# Patient Record
Sex: Male | Born: 1970 | Race: Black or African American | Hispanic: No | Marital: Single | Smoking: Never smoker
Health system: Southern US, Community
[De-identification: ages and names within clinical notes are randomized; demographics above are authoritative.]

## PROBLEM LIST (undated history)

## (undated) HISTORY — PX: ABLATION: SHX5711

---

## 2013-09-28 ENCOUNTER — Encounter (HOSPITAL_COMMUNITY): Payer: Self-pay | Admitting: Emergency Medicine

## 2013-09-28 ENCOUNTER — Emergency Department (HOSPITAL_COMMUNITY): Payer: BC Managed Care – PPO

## 2013-09-28 ENCOUNTER — Inpatient Hospital Stay (HOSPITAL_COMMUNITY)
Admission: EM | Admit: 2013-09-28 | Discharge: 2013-10-01 | DRG: 287 | Disposition: A | Discharge status: Home / Self Care | Payer: BC Managed Care – PPO | Attending: Internal Medicine | Admitting: Internal Medicine

## 2013-09-28 DIAGNOSIS — I471 Supraventricular tachycardia: Secondary | ICD-10-CM

## 2013-09-28 DIAGNOSIS — R931 Abnormal findings on diagnostic imaging of heart and coronary circulation: Secondary | ICD-10-CM

## 2013-09-28 DIAGNOSIS — R0602 Shortness of breath: Secondary | ICD-10-CM | POA: Diagnosis present

## 2013-09-28 DIAGNOSIS — F1490 Cocaine use, unspecified, uncomplicated: Secondary | ICD-10-CM | POA: Diagnosis present

## 2013-09-28 DIAGNOSIS — R072 Precordial pain: Principal | ICD-10-CM | POA: Diagnosis present

## 2013-09-28 DIAGNOSIS — R079 Chest pain, unspecified: Secondary | ICD-10-CM

## 2013-09-28 DIAGNOSIS — F1410 Cocaine abuse, uncomplicated: Secondary | ICD-10-CM | POA: Diagnosis present

## 2013-09-28 DIAGNOSIS — I10 Essential (primary) hypertension: Secondary | ICD-10-CM | POA: Diagnosis present

## 2013-09-28 LAB — CBC
HCT: 41.2 % (ref 39.0–52.0)
Hemoglobin: 14.3 g/dL (ref 13.0–17.0)
MCH: 30 pg (ref 26.0–34.0)
MCHC: 34.7 g/dL (ref 30.0–36.0)
MCV: 86.4 fL (ref 78.0–100.0)
Platelets: 273 10*3/uL (ref 150–400)
RBC: 4.77 MIL/uL (ref 4.22–5.81)
RDW: 12.9 % (ref 11.5–15.5)
WBC: 7.1 10*3/uL (ref 4.0–10.5)

## 2013-09-28 LAB — BASIC METABOLIC PANEL
BUN: 20 mg/dL (ref 6–23)
CO2: 24 meq/L (ref 19–32)
Calcium: 9.6 mg/dL (ref 8.4–10.5)
Chloride: 102 mEq/L (ref 96–112)
Creatinine, Ser: 1.43 mg/dL — ABNORMAL HIGH (ref 0.50–1.35)
GFR calc non Af Amer: 58 mL/min — ABNORMAL LOW (ref >90)
GFR, EST AFRICAN AMERICAN: 68 mL/min — AB (ref >90)
Glucose, Bld: 105 mg/dL — ABNORMAL HIGH (ref 70–99)
Potassium: 4 mEq/L (ref 3.7–5.3)
Sodium: 138 mEq/L (ref 137–147)

## 2013-09-28 LAB — D-DIMER, QUANTITATIVE (NOT AT ARMC)

## 2013-09-28 LAB — I-STAT TROPONIN, ED: TROPONIN I, POC: 0 ng/mL (ref 0.00–0.08)

## 2013-09-28 LAB — APTT: aPTT: 28 seconds (ref 24–37)

## 2013-09-28 LAB — TROPONIN I: Troponin I: <0.3 ng/mL (ref <0.30)

## 2013-09-28 LAB — PROTIME-INR
INR: 0.94 (ref 0.00–1.49)
PROTHROMBIN TIME: 12.4 s (ref 11.6–15.2)

## 2013-09-28 LAB — PRO B NATRIURETIC PEPTIDE: Pro B Natriuretic peptide (BNP): 22.8 pg/mL (ref 0–125)

## 2013-09-28 MED ORDER — ACETAMINOPHEN 650 MG RE SUPP: 650.0000 mg | Freq: Four times a day (QID) | RECTAL | Status: DC | PRN

## 2013-09-28 MED ORDER — SODIUM CHLORIDE 0.9 % IV SOLN
INTRAVENOUS | Status: DC
  Administered 2013-09-28: via INTRAVENOUS

## 2013-09-28 MED ORDER — ONDANSETRON HCL 4 MG PO TABS: 4.0000 mg | ORAL_TABLET | Freq: Four times a day (QID) | ORAL | Status: DC | PRN

## 2013-09-28 MED ORDER — ACETAMINOPHEN 325 MG PO TABS
650.0000 mg | ORAL_TABLET | Freq: Four times a day (QID) | ORAL | Status: DC | PRN
  Administered 2013-09-29 – 2013-09-30 (×2): 650 mg via ORAL
  Filled 2013-09-28 (×2): qty 2

## 2013-09-28 MED ORDER — HYDROMORPHONE HCL PF 1 MG/ML IJ SOLN: 0.5000 mg | INTRAMUSCULAR | Status: DC | PRN

## 2013-09-28 MED ORDER — NITROGLYCERIN 2 % TD OINT
0.5000 [in_us] | TOPICAL_OINTMENT | Freq: Four times a day (QID) | TRANSDERMAL | Status: DC
  Administered 2013-09-28 – 2013-09-30 (×6): 0.5 [in_us] via TOPICAL
  Filled 2013-09-28: qty 30

## 2013-09-28 MED ORDER — NITROGLYCERIN 0.4 MG SL SUBL
0.4000 mg | SUBLINGUAL_TABLET | SUBLINGUAL | Status: AC | PRN
  Administered 2013-09-28 (×3): 0.4 mg via SUBLINGUAL
  Filled 2013-09-28: qty 1

## 2013-09-28 MED ORDER — ONDANSETRON HCL 4 MG/2ML IJ SOLN: 4.0000 mg | Freq: Four times a day (QID) | INTRAMUSCULAR | Status: DC | PRN

## 2013-09-28 MED ORDER — SODIUM CHLORIDE 0.9 % IV SOLN
INTRAVENOUS | Status: DC
  Administered 2013-09-28: 20 mL/h via INTRAVENOUS

## 2013-09-28 MED ORDER — ASPIRIN 81 MG PO CHEW
324.0000 mg | CHEWABLE_TABLET | Freq: Once | ORAL | Status: AC
  Administered 2013-09-28: 324 mg via ORAL
  Filled 2013-09-28: qty 4

## 2013-09-28 MED ORDER — ALUM & MAG HYDROXIDE-SIMETH 200-200-20 MG/5ML PO SUSP: 30.0000 mL | Freq: Four times a day (QID) | ORAL | Status: DC | PRN

## 2013-09-28 MED ORDER — OXYCODONE HCL 5 MG PO TABS: 5.0000 mg | ORAL_TABLET | ORAL | Status: DC | PRN

## 2013-09-28 MED ORDER — ASPIRIN EC 325 MG PO TBEC
325.0000 mg | DELAYED_RELEASE_TABLET | Freq: Every day | ORAL | Status: DC
  Administered 2013-09-29 – 2013-10-01 (×3): 325 mg via ORAL
  Filled 2013-09-28 (×3): qty 1

## 2013-09-28 MED ORDER — ENOXAPARIN SODIUM 40 MG/0.4ML ~~LOC~~ SOLN
40.0000 mg | Freq: Every day | SUBCUTANEOUS | Status: DC
Start: 2013-09-28 — End: 2013-10-01
  Administered 2013-09-28 – 2013-09-30 (×3): 40 mg via SUBCUTANEOUS
  Filled 2013-09-28 (×5): qty 0.4

## 2013-09-28 NOTE — ED Notes (Signed)
Pt reports SOB and chest pain when walking or going up steps and feels as if a brick is sitting on chest that started yesterday. Chest pain is to the central. Pt is a non smoker. Pt denies lightheadedness or dizziness, n/v. Pt alert and orient. Pt able to complete full sentences without taking breaks.

## 2013-09-28 NOTE — ED Provider Notes (Signed)
CSN: [id number]     Arrival date & time 09/28/13  1927 History   First MD Initiated Contact with Patient 09/28/13 2008     No chief complaint on file.    (Consider location/radiation/quality/duration/timing/severity/associated sxs/prior Treatment) HPI Comments: 43 year old male presents with chest pain and shortness breath since yesterday. This is exacerbated by exertion. 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 Improves with rest. Currently has a mild 4/10 heaviness. Felt clammy with exertion. No prior hx of HTN, HLD, Diabetes or smoking. 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 Travels frequently by car. No hemoptysis, pleuritic chest pain or leg pain/swelling. No history of prior DVT.   History reviewed. No pertinent past medical history. History reviewed. No pertinent past surgical history. History reviewed. No pertinent family history. History  Substance Use Topics  . Smoking status: Never Smoker   . Smokeless tobacco: Not on file  . Alcohol Use: Yes    Review of Systems  Constitutional: Negative for fever, chills and diaphoresis.  HENT: Negative for congestion.   Respiratory: Positive for shortness of breath. Negative for cough.   Cardiovascular: Positive for chest pain. Negative for leg swelling.  Gastrointestinal: Negative for nausea, vomiting and abdominal pain.  All other systems reviewed and are negative.      Allergies  Review of patient's allergies indicates no known allergies.  Home Medications  No current outpatient prescriptions on file. BP 118/84  Pulse 93  Temp(Src) 98.5 F (36.9 C) (Oral)  Resp 16  SpO2 97% Physical Exam  Nursing note and vitals reviewed. Constitutional: He is oriented to person, place, and time. He appears well-developed and well-nourished. No  distress.  HENT:  Head: Normocephalic and atraumatic.  Right Ear: External ear normal.  Left Ear: External ear normal.  Nose: Nose normal.  Eyes: Right eye exhibits no discharge. Left eye exhibits no discharge.  Neck: Neck supple.  Cardiovascular: Normal rate, regular rhythm, normal heart sounds and intact distal pulses.   Pulmonary/Chest: Effort normal and breath sounds normal. He exhibits tenderness (mild chest wall tenderness).  Abdominal: Soft. There is no tenderness.  Musculoskeletal: He exhibits no edema and no tenderness.  Neurological: He is alert and oriented to person, place, and time.  Skin: Skin is warm and dry.    ED Course  Procedures (including critical care time) Labs Review Labs Reviewed  BASIC METABOLIC PANEL - Abnormal; Notable for the following:    Glucose, Bld 105 (*)    Creatinine, Ser 1.43 (*)    GFR calc non Af Amer 58 (*)    GFR calc Af Amer 68 (*)    All other components within normal limits  CBC  PRO B NATRIURETIC PEPTIDE  TROPONIN I  APTT  PROTIME-INR  D-DIMER, QUANTITATIVE  I-STAT TROPOININ, ED   Imaging Review Dg Chest Portable 1 View  09/28/2013   CLINICAL DATA Chest pain, shortness of breath  EXAM PORTABLE CHEST - 1 VIEW  COMPARISON None.  FINDINGS The heart size and mediastinal contours are within normal limits. Calcified left hilar lymph node from old granulomatous disease is noted. Both lungs are clear. The visualized skeletal structures are unremarkable.  IMPRESSION No active cardiopulmonary disease.  SIGNATURE  Electronically Signed   By: Sherian Rein M.D.   On: 09/28/2013 21:12     EKG Interpretation  Date/Time:  Tuesday September 28 2013 19:48:16 EDT Ventricular Rate:  90 PR Interval:  133 QRS Duration: 77 QT Interval:  280 QTC Calculation: 342 R Axis:   87 Text Interpretation:  Sinus rhythm Left ventricular hypertrophy ST  elevation in inferior and lateral precordial leads No reciprocal changes  to suggest STEMI No old tracing  to compare Confirmed by Gilbert Narain  MD,  Kenna ([id number]) on 09/28/2013 8:46:37 PM      MDM   Final diagnoses:  Chest pain    8:35 PM EKG with ST elevation, no reciprocal changes. With his story and this EKG however, a code STEMI was called. D/w Dr. Garnette ScheuermannHank Smith. He is on his way to cath lab with another patient, will look at EKG when he gets to a computer. Will hold on transporting patient at this time. Given ASA, nitro and will get workup.  8:38 PM Will cancel STEMI. Dr. Katrinka BlazingSmith feels this likely early repol vs pericarditis. Will continue workup.  10:02 PM Workup negative. Trop and ddimer negative. Otherwise low risk for PE, feel he is ruled out. His CP is still there despite 2 nitro, but he appears comfortable. Given his significant dyspnea on exertion, feel he would be best served in observation on tele with possible stress in AM. Will consult hospitalist.  Audree CamelScott T Remonia Otte, MD 09/28/13 2330

## 2013-09-28 NOTE — H&P (Signed)
[facility] History and Physical  Mrk Buzby ZOX:[id number] DOB: 1971/02/21 DOA: 09/28/2013  Referring physician:  PCP: No PCP Per Patient  Specialists:   Chief Complaint:   Chest Pain and SOB  HPI: Alan Rasmussen is a 43 y.o. male with previously healthy until 1 day ago when he began to have substernal chest pain described as heaviness associated with SOB.   The pain at the worse was an 8/10.   He reports having DOE, and had extreme SOB when he tried to go up the stair while working today and had to stop to catch his breath 3 times.  He knew something was wrong and he came to the ED for evaluation.  He has not had any similar episodes, but he does report having a cardiac ablation done in 2007 in [place] where he lives.  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       Review of Systems:  Constitutional: No Weight Loss, No Weight Gain, Night Sweats, Fevers, Chills, Fatigue, or Generalized Weakness HEENT: No Headaches, Difficulty Swallowing,Tooth/Dental Problems,Sore Throat,  No Sneezing, Rhinitis, Ear Ache, Nasal Congestion, or Post Nasal Drip,  Cardio-vascular:  +Chest pain, Orthopnea, PND, Edema in lower extremities, Anasarca, Dizziness, Palpitations  Resp: +Dyspnea, +DOE, No Productive Cough, No Non-Productive Cough, No Hemoptysis, No Change in Color of Mucus,  No Wheezing.    GI: No Heartburn, Indigestion, Abdominal Pain, Nausea, Vomiting, Diarrhea, Change in Bowel Habits,  Loss of Appetite  GU: No Dysuria, Change in Color of Urine, No Urgency or Frequency.  No flank pain.  Musculoskeletal: No Joint Pain or Swelling.  No Decreased Range of Motion. No Back Pain.  Neurologic: No Syncope, No Seizures, Muscle Weakness, Paresthesia, Vision Disturbance or Loss, No Diplopia, No Vertigo, No Difficulty Walking,  Skin: No Rash or Lesions. Psych: No Change in Mood or Affect. No Depression or Anxiety. No Memory loss. No Confusion or  Hallucinations   History reviewed. No pertinent past medical history.    History reviewed. No pertinent past surgical history.     Prior to Admission medications   Not on File      No Known Allergies   Social History:  reports that he has never smoked. He does not have any smokeless tobacco history on file. He reports that he drinks alcohol. He reports that he does not use illicit drugs.     History reviewed. No pertinent family history.     Physical Exam:  GEN:  Pleasant Well Nourished Well Developed  43 y.o. African American male  examined  and in no acute distress; cooperative with exam Filed Vitals:   09/28/13 2044 09/28/13 2054 09/28/13 2127 09/28/13 2130  BP: 143/100 144/91 125/102 137/88  Pulse:  90  84  Temp:      TempSrc:      Resp: 17 24 19 14   SpO2: 100% 100% 98% 99%   Blood pressure 137/88, pulse 84, temperature 98.5 F (36.9 C), temperature source Oral, resp. rate 14, SpO2 99.00%. PSYCH: He is alert and oriented x4; does not appear anxious does not appear depressed; affect is normal HEENT: Normocephalic and Atraumatic, Mucous membranes pink; PERRLA; EOM intact; Fundi:  Benign;  No scleral icterus, Nares: Patent, Oropharynx: Clear, Fair Dentition, Neck:  FROM, no cervical lymphadenopathy nor thyromegaly or carotid bruit; no JVD; Breasts:: Not examined CHEST WALL: No tenderness CHEST: Normal respiration, clear to auscultation bilaterally HEART: Regular rate and rhythm; no murmurs rubs or gallops  BACK: No kyphosis or scoliosis; no CVA tenderness ABDOMEN: Positive Bowel Sounds, Scaphoid, soft non-tender; no masses, no organomegaly. Rectal Exam: Not done EXTREMITIES: No cyanosis, clubbing or edema; no ulcerations. Genitalia: not examined PULSES: 2+ and symmetric SKIN: Normal hydration no rash or ulceration CNS:  Alert and Oriented x 4, No Focal Deficits.    Vascular: pulses palpable throughout    Labs on Admission:  Basic Metabolic Panel:  Recent  Labs Lab 09/28/13 1957  NA 138  K 4.0  CL 102  CO2 24  GLUCOSE 105*  BUN 20  CREATININE 1.43*  CALCIUM 9.6   Liver Function Tests: No results found for this basename: AST, ALT, ALKPHOS, BILITOT, PROT, ALBUMIN,  in the last 168 hours No results found for this basename: LIPASE, AMYLASE,  in the last 168 hours No results found for this basename: AMMONIA,  in the last 168 hours CBC:  Recent Labs Lab 09/28/13 1957  WBC 7.1  HGB 14.3  HCT 41.2  MCV 86.4  PLT 273   Cardiac Enzymes:  Recent Labs Lab 09/28/13 1957  TROPONINI <0.30    BNP (last 3 results)  Recent Labs  09/28/13 2028  PROBNP 22.8   CBG: No results found for this basename: GLUCAP,  in the last 168 hours  Radiological Exams on Admission: Dg Chest Portable 1 View  09/28/2013   CLINICAL DATA Chest pain, shortness of breath  EXAM PORTABLE CHEST - 1 VIEW  COMPARISON None.  FINDINGS The heart size and mediastinal contours are within normal limits. Calcified left hilar lymph node from old granulomatous disease is noted. Both lungs are clear. The visualized skeletal structures are unremarkable.  IMPRESSION No active cardiopulmonary disease.  SIGNATURE  Electronically Signed   By: Sherian ReinWei-Chen  Lin M.D.   On: 09/28/2013 21:12      EKG: Independently reviewed.  Normal Sinus Rhythm with LVH changes and Diffuse Nonspecific ST changes    Assessment/Plan:   43 y.o. male with  Principal Problem:   Chest pain    1.  Chest Pain-   Telemetry Monitoring, cycle Troponins, Nitropaste, O2 ASA Rx.  2D ECHO and Consider Stress Testing.     2.  DVT Prophylaxis with Lovenox.      Code Status:   FULL CODE Family Communication:    No Family Present Disposition Plan:         Time spent:  7960 Minutes  Ron ParkerJENKINS,Solina Heron C [facility] Pager [phone]  If 7PM-7AM, please contact night-coverage [email or web address] Password [email or web address] 09/28/2013, 10:53 PM

## 2013-09-28 NOTE — ED Notes (Signed)
Pt was given a turkey sandwich and a ginger ale 

## 2013-09-28 NOTE — ED Notes (Signed)
Pt does not have to void at this time. A urinal was left for his use at the bed side.

## 2013-09-29 DIAGNOSIS — F1410 Cocaine abuse, uncomplicated: Secondary | ICD-10-CM

## 2013-09-29 DIAGNOSIS — R072 Precordial pain: Secondary | ICD-10-CM

## 2013-09-29 DIAGNOSIS — R079 Chest pain, unspecified: Secondary | ICD-10-CM

## 2013-09-29 LAB — CBC
HCT: 39.5 % (ref 39.0–52.0)
Hemoglobin: 13.4 g/dL (ref 13.0–17.0)
MCH: 29.5 pg (ref 26.0–34.0)
MCHC: 33.9 g/dL (ref 30.0–36.0)
MCV: 86.8 fL (ref 78.0–100.0)
Platelets: 247 10*3/uL (ref 150–400)
RBC: 4.55 MIL/uL (ref 4.22–5.81)
RDW: 12.9 % (ref 11.5–15.5)
WBC: 4.7 10*3/uL (ref 4.0–10.5)

## 2013-09-29 LAB — TROPONIN I
Troponin I: <0.3 ng/mL (ref <0.30)
Troponin I: <0.3 ng/mL (ref <0.30)

## 2013-09-29 LAB — BASIC METABOLIC PANEL
BUN: 17 mg/dL (ref 6–23)
CO2: 25 meq/L (ref 19–32)
CREATININE: 1.26 mg/dL (ref 0.50–1.35)
Calcium: 9 mg/dL (ref 8.4–10.5)
Chloride: 103 mEq/L (ref 96–112)
GFR calc Af Amer: 79 mL/min — ABNORMAL LOW (ref >90)
GFR calc non Af Amer: 68 mL/min — ABNORMAL LOW (ref >90)
Glucose, Bld: 100 mg/dL — ABNORMAL HIGH (ref 70–99)
POTASSIUM: 3.7 meq/L (ref 3.7–5.3)
Sodium: 140 mEq/L (ref 137–147)

## 2013-09-29 LAB — RAPID URINE DRUG SCREEN, HOSP PERFORMED
Amphetamines: NOT DETECTED
BARBITURATES: NOT DETECTED
Benzodiazepines: NOT DETECTED
COCAINE: POSITIVE — AB
Opiates: NOT DETECTED
Tetrahydrocannabinol: NOT DETECTED

## 2013-09-29 LAB — C-REACTIVE PROTEIN: CRP: <0.5 mg/dL — ABNORMAL LOW (ref <0.60)

## 2013-09-29 LAB — SEDIMENTATION RATE: SED RATE: 8 mm/h (ref 0–16)

## 2013-09-29 NOTE — Consult Note (Signed)
Chart reviewed and patient seen, interviewed and examined. Agree with not as stated above.  Patient with only cardiac history being SVT ablation years ago who is a nonsmoker although did cocaine a week ago presents with CP.  He states that he did cocaine only once about 10 days about 10 days ago although his UDS is still positive.  His chest pain occurs with exertion and he has noticed decreased exercise tolerance and DOE.  EKG shows LVH with diffuse ST elevation most consistent with early repolarization although could also be seen with pericarditis.  His symptoms are not consistent with acute pericarditis in that there is no positional component and symptoms are exertional.  There is no pleuritic component to his pain.  His d-dimer was normal.  He has ruled out by cardiac enzymes for MI.  Recommend checking a CRP/ESR and if elevated might make pericarditis a more likely etiology of CP.  2D echo did not show any pericardial effusion.  LVF was normal.  Recommend Stress myoview in the am.

## 2013-09-29 NOTE — Progress Notes (Signed)
Echocardiogram 2D Echocardiogram has been performed.  Margaretmary Prisk 09/29/2013, 11:25 AM 

## 2013-09-29 NOTE — Consult Note (Signed)
Reason for Consult: Chest Pressure Referring Physician:   [name] is an 43 y.o. male.  HPI: Alan Rasmussen is a 11 yom with a pmhx of cardiac ablation for accessory pathway in 2007 in [place] who presented to [facility] ED on 3/10 with chest discomfort and SOB since Monday, 3/9.  Pt describes the discomfort as being a "pressure" which is exacerbated with exertion and relieved by rest.  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  He had to stop three times before getting to the top and rested for ten minutes when he got there.  Pt reports he is working in [place] with a company based out of [place], and first noticed the pain after driving from [place] to [place].  Pt reports having a dry, non-productive cough since Sunday, 3/8 which does not affect his symptoms. Pt denies nausea, vomiting, fever, chills, abd pain, leg pain/swelling.  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  Pt admits to occasional Marijuana use and alcohol use-- (1) 12-pack/weekend.  Pt denies tobacco use.    History reviewed. No pertinent past medical history.  Past Surgical History  Procedure Laterality Date  . Ablation       History reviewed. No pertinent family history.  No history of CAD/MI in mother or father.    Social History:  reports that he has never smoked. He does not have any smokeless tobacco history on file. He reports that he drinks alcohol. He reports that he does not use illicit drugs.  Allergies: No Known Allergies  Medications:   NONE Prior to Admission medications   Not on File     Results for orders placed during the hospital encounter of 09/28/13 (from the past 48 hour(s))  CBC     Status: None   Collection Time    09/28/13  7:57 PM      Result Value Ref Range   WBC 7.1  4.0 - 10.5 K/uL   RBC 4.77  4.22 - 5.81 MIL/uL   Hemoglobin  14.3  13.0 - 17.0 g/dL   HCT 41.2  39.0 - 52.0 %   MCV 86.4  78.0 - 100.0 fL   MCH 30.0  26.0 - 34.0 pg   MCHC 34.7  30.0 - 36.0 g/dL   RDW 12.9  11.5 - 15.5 %   Platelets 273  150 - 400 K/uL  BASIC METABOLIC PANEL     Status: Abnormal   Collection Time    09/28/13  7:57 PM      Result Value Ref Range   Sodium 138  137 - 147 mEq/L   Potassium 4.0  3.7 - 5.3 mEq/L   Chloride 102  96 - 112 mEq/L   CO2 24  19 - 32 mEq/L   Glucose, Bld 105 (*) 70 - 99 mg/dL   BUN 20  6 - 23 mg/dL   Creatinine, Ser 1.43 (*) 0.50 - 1.35 mg/dL   Calcium 9.6  8.4 - 10.5 mg/dL   GFR calc non Af Amer 58 (*) >90 mL/min   GFR calc Af Amer 68 (*) >90 mL/min   Comment: (NOTE)     The eGFR has been calculated using the CKD EPI equation.     This calculation has not been validated in all clinical situations.  eGFR's persistently <90 mL/min signify possible Chronic Kidney     Disease.  TROPONIN I     Status: None   Collection Time    09/28/13  7:57 PM      Result Value Ref Range   Troponin I <0.30  <0.30 ng/mL   Comment:            Due to the release kinetics of cTnI,     a negative result within the first hours     of the onset of symptoms does not rule out     myocardial infarction with certainty.     If myocardial infarction is still suspected,     repeat the test at appropriate intervals.  PRO B NATRIURETIC PEPTIDE     Status: None   Collection Time    09/28/13  8:28 PM      Result Value Ref Range   Pro B Natriuretic peptide (BNP) 22.8  0 - 125 pg/mL  APTT     Status: None   Collection Time    09/28/13  8:31 PM      Result Value Ref Range   aPTT 28  24 - 37 seconds  PROTIME-INR     Status: None   Collection Time    09/28/13  8:31 PM      Result Value Ref Range   Prothrombin Time 12.4  11.6 - 15.2 seconds   INR 0.94  0.00 - 1.49  D-DIMER, QUANTITATIVE     Status: None   Collection Time    09/28/13  8:31 PM      Result Value Ref Range   D-Dimer, Quant <0.27  0.00 - 0.48 ug/mL-FEU    Comment:            AT THE INHOUSE ESTABLISHED CUTOFF     VALUE OF 0.48 ug/mL FEU,     THIS ASSAY HAS BEEN DOCUMENTED     IN THE LITERATURE TO HAVE     A SENSITIVITY AND NEGATIVE     PREDICTIVE VALUE OF AT LEAST     98 TO 99%.  THE TEST RESULT     SHOULD BE CORRELATED WITH     AN ASSESSMENT OF THE CLINICAL     PROBABILITY OF DVT / VTE.  Randolm Idol, ED     Status: None   Collection Time    09/28/13  8:48 PM      Result Value Ref Range   Troponin i, poc 0.00  0.00 - 0.08 ng/mL   Comment 3            Comment: Due to the release kinetics of cTnI,     a negative result within the first hours     of the onset of symptoms does not rule out     myocardial infarction with certainty.     If myocardial infarction is still suspected,     repeat the test at appropriate intervals.  TROPONIN I     Status: None   Collection Time    09/29/13 12:00 AM      Result Value Ref Range   Troponin I <0.30  <0.30 ng/mL   Comment:            Due to the release kinetics of cTnI,     a negative result within the first hours     of the onset of symptoms does not rule out     myocardial infarction with certainty.     If  myocardial infarction is still suspected,     repeat the test at appropriate intervals.  URINE RAPID DRUG SCREEN (HOSP PERFORMED)     Status: Abnormal   Collection Time    09/29/13  4:53 AM      Result Value Ref Range   Opiates NONE DETECTED  NONE DETECTED   Cocaine POSITIVE (*) NONE DETECTED   Benzodiazepines NONE DETECTED  NONE DETECTED   Amphetamines NONE DETECTED  NONE DETECTED   Tetrahydrocannabinol NONE DETECTED  NONE DETECTED   Barbiturates NONE DETECTED  NONE DETECTED   Comment:            DRUG SCREEN FOR MEDICAL PURPOSES     ONLY.  IF CONFIRMATION IS NEEDED     FOR ANY PURPOSE, NOTIFY LAB     WITHIN 5 DAYS.                LOWEST DETECTABLE LIMITS     FOR URINE DRUG SCREEN     Drug Class       Cutoff (ng/mL)     Amphetamine      1000     Barbiturate      200      Benzodiazepine   329     Tricyclics       518     Opiates          300     Cocaine          300     THC              50  TROPONIN I     Status: None   Collection Time    09/29/13  6:45 AM      Result Value Ref Range   Troponin I <0.30  <0.30 ng/mL   Comment:            Due to the release kinetics of cTnI,     a negative result within the first hours     of the onset of symptoms does not rule out     myocardial infarction with certainty.     If myocardial infarction is still suspected,     repeat the test at appropriate intervals.  BASIC METABOLIC PANEL     Status: Abnormal   Collection Time    09/29/13  6:45 AM      Result Value Ref Range   Sodium 140  137 - 147 mEq/L   Potassium 3.7  3.7 - 5.3 mEq/L   Chloride 103  96 - 112 mEq/L   CO2 25  19 - 32 mEq/L   Glucose, Bld 100 (*) 70 - 99 mg/dL   BUN 17  6 - 23 mg/dL   Creatinine, Ser 1.26  0.50 - 1.35 mg/dL   Calcium 9.0  8.4 - 10.5 mg/dL   GFR calc non Af Amer 68 (*) >90 mL/min   GFR calc Af Amer 79 (*) >90 mL/min   Comment: (NOTE)     The eGFR has been calculated using the CKD EPI equation.     This calculation has not been validated in all clinical situations.     eGFR's persistently <90 mL/min signify possible Chronic Kidney     Disease.  CBC     Status: None   Collection Time    09/29/13  6:45 AM      Result Value Ref Range   WBC 4.7  4.0 - 10.5 K/uL   RBC 4.55  4.22 -  5.81 MIL/uL   Hemoglobin 13.4  13.0 - 17.0 g/dL   HCT 10.6  26.9 - 48.5 %   MCV 86.8  78.0 - 100.0 fL   MCH 29.5  26.0 - 34.0 pg   MCHC 33.9  30.0 - 36.0 g/dL   RDW 46.2  70.3 - 50.0 %   Platelets 247  150 - 400 K/uL  TROPONIN I     Status: None   Collection Time    09/29/13 12:36 PM      Result Value Ref Range   Troponin I <0.30  <0.30 ng/mL   Comment:            Due to the release kinetics of cTnI,     a negative result within the first hours     of the onset of symptoms does not rule out     myocardial infarction with certainty.     If  myocardial infarction is still suspected,     repeat the test at appropriate intervals.    Dg Chest Portable 1 View  09/28/2013   CLINICAL DATA Chest pain, shortness of breath  EXAM PORTABLE CHEST - 1 VIEW  COMPARISON None.  FINDINGS The heart size and mediastinal contours are within normal limits. Calcified left hilar lymph node from old granulomatous disease is noted. Both lungs are clear. The visualized skeletal structures are unremarkable.  IMPRESSION No active cardiopulmonary disease.  SIGNATURE  Electronically Signed   By: Sherian Rein M.D.   On: 09/28/2013 21:12    Review of Systems  Constitutional: Negative for fever, chills, weight loss and malaise/fatigue.  Respiratory: Positive for cough and shortness of breath. Negative for hemoptysis, sputum production and wheezing.   Cardiovascular: Positive for chest pain. Negative for palpitations, claudication and leg swelling.  Gastrointestinal: Positive for diarrhea. Negative for nausea, vomiting and abdominal pain.  Genitourinary: Negative for dysuria, urgency and frequency.  Neurological: Negative for dizziness, tingling, tremors and loss of consciousness.   Blood pressure 134/89, pulse 87, temperature 98.4 F (36.9 C), temperature source Oral, resp. rate 18, height 6\' 1"  (1.854 m), weight 165 lb 8 oz (75.07 kg), SpO2 92.00%. Physical Exam  Constitutional: He is oriented to person, place, and time. He appears well-developed and well-nourished. No distress.  HENT:  Head: Normocephalic.  Eyes: EOM are normal. Pupils are equal, round, and reactive to light. No scleral icterus.  Neck: Normal range of motion. No JVD present.  Cardiovascular: Normal rate, regular rhythm, S1 normal, S2 normal and normal heart sounds.  Exam reveals no friction rub.   No murmur heard. Pulses:      Radial pulses are 2+ on the right side, and 2+ on the left side.       Dorsalis pedis pulses are 2+ on the right side, and 2+ on the left side.  No carotid bruit  noted bilaterally.  Respiratory: Breath sounds normal. No respiratory distress. He has no decreased breath sounds. He has no wheezes. He has no rhonchi. He has no rales. He exhibits no tenderness.  GI: Soft. Normal appearance and bowel sounds are normal. There is no tenderness.  Musculoskeletal: He exhibits no edema.  Neurological: He is alert and oriented to person, place, and time. He exhibits normal muscle tone.  Skin: Skin is warm and dry. He is not diaphoretic.  Psychiatric: He has a normal mood and affect.    Assessment/Plan: Principal Problem:   Chest pain  1. Chest pain- Treadmill myovue to assess exertional cp/dysnpea 2. R/O  Pericarditis- assess ESR/CRP  Alan Rasmussen 09/29/2013, 3:40 PM

## 2013-09-29 NOTE — Progress Notes (Addendum)
[facility] PROGRESS NOTE  Alan Rasmussen ZOX:[id number] DOB: May 07, 1971 DOA: 09/28/2013  PCP: None  Brief HPI: Alan Rasmussen is a 43 y.o. male with previous history of ablation for accessory pathway in 2007 in [place]. He presented with chest pressure mainly with exertion along with shortness of breath. He is here with his company on a work project in [place] this week from [place].   Consultants: Cards  Procedures: None  Antibiotics: None  Subjective: Patient feels better but continues to have 3/10 chest pressure. Increases sometimes with deep breathing. Smoked marijuana a few days ago but denies using cocaine.  Objective: Vital Signs  Filed Vitals:   09/28/13 2127 09/28/13 2130 09/28/13 2321 09/29/13 0641  BP: 125/102 137/88 140/90 134/89  Pulse:  84 80 87  Temp:   98.4 F (36.9 C) 98.4 F (36.9 C)  TempSrc:   Oral Oral  Resp: 19 14 18 18   Height:   6\' 1"  (1.854 m)   Weight:   75.07 kg (165 lb 8 oz)   SpO2: 98% 99% 97% 92%    Intake/Output Summary (Last 24 hours) at 09/29/13 8119 Last data filed at 09/29/13 0453  Gross per 24 hour  Intake      0 ml  Output    130 ml  Net   -130 ml   Filed Weights   09/28/13 2321  Weight: 75.07 kg (165 lb 8 oz)   General appearance: alert, cooperative, appears stated age and no distress Head: Normocephalic, without obvious abnormality, atraumatic Throat: lips, mucosa, and tongue normal; teeth and gums normal Resp: clear to auscultation bilaterally Cardio: regular rate and rhythm, S1, S2 normal, no murmur, click, rub or gallop GI: soft, non-tender; bowel sounds normal; no masses,  no organomegaly Extremities: extremities normal, atraumatic, no cyanosis or edema Skin: Skin color, texture, turgor normal. No rashes or lesions Neurologic: Alert and oriented X 3, normal strength and tone. Normal symmetric reflexes. Normal coordination and gait  Lab Results:  Basic Metabolic Panel:  Recent Labs Lab 09/28/13 1957  09/29/13 0645  NA 138 140  K 4.0 3.7  CL 102 103  CO2 24 25  GLUCOSE 105* 100*  BUN 20 17  CREATININE 1.43* 1.26  CALCIUM 9.6 9.0   CBC:  Recent Labs Lab 09/28/13 1957 09/29/13 0645  WBC 7.1 4.7  HGB 14.3 13.4  HCT 41.2 39.5  MCV 86.4 86.8  PLT 273 247   Cardiac Enzymes:  Recent Labs Lab 09/28/13 1957 09/29/13 09/29/13 0645  TROPONINI <0.30 <0.30 <0.30   BNP (last 3 results)  Recent Labs  09/28/13 2028  PROBNP 22.8    Studies/Results: Dg Chest Portable 1 View  09/28/2013   CLINICAL DATA Chest pain, shortness of breath  EXAM PORTABLE CHEST - 1 VIEW  COMPARISON None.  FINDINGS The heart size and mediastinal contours are within normal limits. Calcified left hilar lymph node from old granulomatous disease is noted. Both lungs are clear. The visualized skeletal structures are unremarkable.  IMPRESSION No active cardiopulmonary disease.  SIGNATURE  Electronically Signed   By: Sherian Rein M.D.   On: 09/28/2013 21:12    Medications:  Scheduled: . aspirin EC  325 mg Oral Daily  . enoxaparin (LOVENOX) injection  40 mg Subcutaneous QHS  . nitroGLYCERIN  0.5 inch Topical 4 times per day   Continuous: . sodium chloride 20 mL/hr at 09/28/13 2300  . sodium chloride 10 mL/hr at 09/28/13 2333   JYN:[id number], acetaminophen, alum & mag hydroxide-simeth, HYDROmorphone (DILAUDID) injection,  ondansetron (ZOFRAN) IV, ondansetron, oxyCODONE  Assessment/Plan:  Principal Problem:   Chest pain    Chest Pain EKG shows diffuse ST changes but unchanged this morning. Pain not characteristic of pericarditis. UDS positive for cocaine which could be contributing. Previous history of ablation of accessory pathway. Will consult cardiology. Has ruled out for ACS.  Cocaine Use Patient counseled to stop illicit drug use. Educated on vascular side effects of cocaine.  Code Status: Full Code  DVT Prophylaxis: Enoxaparin    Family Communication: Discussed with patient    Disposition Plan: Will return home when improved.    LOS: 1 day   [facility] CenterKRISHNAN,Zayneb Baucum  [facility] Pager [phone] 09/29/2013, 8:33 AM  If 8PM-8AM, please contact night-coverage at [email or web address], password [email or web address]

## 2013-09-29 NOTE — Progress Notes (Signed)
UR completed 

## 2013-09-30 ENCOUNTER — Other Ambulatory Visit: Payer: Self-pay

## 2013-09-30 ENCOUNTER — Observation Stay (HOSPITAL_COMMUNITY)
Admit: 2013-09-30 | Discharge: 2013-09-30 | Disposition: A | Discharge status: Home / Self Care | Payer: BC Managed Care – PPO | Attending: Physician Assistant | Admitting: Physician Assistant

## 2013-09-30 DIAGNOSIS — R0789 Other chest pain: Secondary | ICD-10-CM

## 2013-09-30 DIAGNOSIS — R9439 Abnormal result of other cardiovascular function study: Secondary | ICD-10-CM

## 2013-09-30 MED ORDER — TECHNETIUM TC 99M SESTAMIBI GENERIC - CARDIOLITE
10.0000 | Freq: Once | INTRAVENOUS | Status: AC | PRN
  Administered 2013-09-30: 10 via INTRAVENOUS

## 2013-09-30 MED ORDER — TECHNETIUM TC 99M SESTAMIBI GENERIC - CARDIOLITE
30.0000 | Freq: Once | INTRAVENOUS | Status: AC | PRN
  Administered 2013-09-30: 30 via INTRAVENOUS

## 2013-09-30 NOTE — Progress Notes (Signed)
Subjective: No complaints. Denies further chest pain/ SOB.   Objective: Vital signs in last 24 hours: Temp:  [97.7 F (36.5 C)-98.6 F (37 C)] 98 F (36.7 C) (03/12 1127) Pulse Rate:  [77-155] 77 (03/12 1127) Resp:  [16-20] 20 (03/12 1127) BP: (120-164)/(75-97) 136/97 mmHg (03/12 1127) SpO2:  [98 %-100 %] 100 % (03/12 1127) Last BM Date: 09/28/13  Intake/Output from previous day: 03/11 0701 - 03/12 0700 In: 240 [P.O.:240] Out: -  Intake/Output this shift:    Medications Current Facility-Administered Medications  Medication Dose Route Frequency Provider Last Rate Last Dose  . 0.9 %  sodium chloride infusion   Intravenous Continuous Ephraim Hamburger, MD 20 mL/hr at 09/28/13 2300    . 0.9 %  sodium chloride infusion   Intravenous Continuous Theressa Millard, MD 10 mL/hr at 09/28/13 2333    . acetaminophen (TYLENOL) tablet 650 mg  650 mg Oral Q6H PRN Theressa Millard, MD   650 mg at 09/29/13 0446   Or  . acetaminophen (TYLENOL) suppository 650 mg  650 mg Rectal Q6H PRN Theressa Millard, MD      . alum & mag hydroxide-simeth (MAALOX/MYLANTA) 200-200-20 MG/5ML suspension 30 mL  30 mL Oral Q6H PRN Theressa Millard, MD      . aspirin EC tablet 325 mg  325 mg Oral Daily Theressa Millard, MD   325 mg at 09/30/13 1129  . enoxaparin (LOVENOX) injection 40 mg  40 mg Subcutaneous QHS Theressa Millard, MD   40 mg at 09/29/13 2249  . HYDROmorphone (DILAUDID) injection 0.5-1 mg  0.5-1 mg Intravenous Q3H PRN Theressa Millard, MD      . ondansetron (ZOFRAN) tablet 4 mg  4 mg Oral Q6H PRN Theressa Millard, MD       Or  . ondansetron (ZOFRAN) injection 4 mg  4 mg Intravenous Q6H PRN Theressa Millard, MD      . oxyCODONE (Oxy IR/ROXICODONE) immediate release tablet 5 mg  5 mg Oral Q4H PRN Theressa Millard, MD        PE: General appearance: alert, cooperative and no distress Neck: no carotid bruit Lungs: clear to auscultation bilaterally Heart: regular rate and  rhythm, S1, S2 normal, no murmur, click, rub or gallop Extremities: no LEE Pulses: 2+ and symmetric Skin: warm and dry Neurologic: Grossly normal  Lab Results:   Recent Labs  09/28/13 1957 09/29/13 0645  WBC 7.1 4.7  HGB 14.3 13.4  HCT 41.2 39.5  PLT 273 247   BMET  Recent Labs  09/28/13 1957 09/29/13 0645  NA 138 140  K 4.0 3.7  CL 102 103  CO2 24 25  GLUCOSE 105* 100*  BUN 20 17  CREATININE 1.43* 1.26  CALCIUM 9.6 9.0   PT/INR  Recent Labs  09/28/13 2031  LABPROT 12.4  INR 0.94    Cardiac Enzymes Cardiac Panel (last 3 results)  Recent Labs  09/29/13 09/29/13 0645 09/29/13 1236  TROPONINI <0.30 <0.30 <0.30    Studies/Results: Treadmill Nuclear Stress Test - results pending  Assessment/Plan    Principal Problem:   Chest pain  Plan: No further CP/SOB. Cardiac enzymes negative x 3. Pericarditis is less likely with normal ESR/CRP. Pt was transferred to [facility] earlier today to undergo a treadmill nuclear stress test. He tolerated the procedure well. He reached Stage 4 of the Bruce Protocol. He exercised for a total of 9:30 sec. He was able to exercise beyond his target  HR of 149 bpm.  The test was stopped due to leg fatigue. He had no exertional chest pain. Images pending. [facility] to read. If no signs of pharmacological induced ischemia, then ok to send home today. If abnormal, he will require a LHC. Dr. Claiborne Billings to follow.     LOS: 2 days    Brittainy M. Ladoris Gene 09/30/2013 1:48 PM  Nuclear IMPRESSION:  Myocardial perfusion imaging is equivocal and cannot exclude areas  of reversibility. Question reversibility along the mid segment of  the anterior wall. Evaluation of the inferior wall is markedly  limited due to large amount of gut uptake on the resting images.  Calculated ejection fraction is 52%. Mild hypokinesia along the  septal wall    Agree with assessment and plan. Discussed nuclear finding with primary hospitalist.  Hypokinesis noted with possible ischemia. Recommend definitive evaluation with cath tomorrow. Will further discuss procedure in am with patient.   Troy Sine, MD, [facility] 09/30/2013 5:38 PM

## 2013-09-30 NOTE — Progress Notes (Signed)
[facility] PROGRESS NOTE  Alan GlassmanDerrick Doescher OZD:[id number] DOB: 1971/01/15 DOA: 09/28/2013  PCP: None  Brief HPI: Alan Rasmussen is a 43 y.o. male with previous history of ablation for accessory pathway in 2007 in [place]. He presented with chest pressure mainly with exertion along with shortness of breath. He is here this week on a work project in [place] from [place].   Consultants: Cards  Procedures:  2D ECHO 3/11 Left ventricle: The cavity size was normal. Systolic function was normal. The estimated ejection fraction was in the range of 55% to 60%. Wall motion was normal; there were no regional wall motion abnormalities. Left ventricular diastolic function parameters were normal. No pericardial effusion.  Antibiotics: None  Subjective: Patient feels fine. Some chest discomfort at 2/10. No other symptoms.   Objective: Vital Signs  Filed Vitals:   09/29/13 1545 09/29/13 2118 09/30/13 0211 09/30/13 0523  BP: 123/94 133/87 124/75 126/82  Pulse: 78 84 77 79  Temp: 98.6 F (37 C) 98.2 F (36.8 C) 97.8 F (36.6 C) 97.7 F (36.5 C)  TempSrc: Oral Oral Oral Oral  Resp: 20 16 16 16   Height:      Weight:      SpO2: 99% 99% 99% 98%    Intake/Output Summary (Last 24 hours) at 09/30/13 [id number] Last data filed at 09/29/13 1545  Gross per 24 hour  Intake    240 ml  Output      0 ml  Net    240 ml   Filed Weights   09/28/13 2321  Weight: 75.07 kg (165 lb 8 oz)   General appearance: alert, cooperative, appears stated age and no distress Resp: clear to auscultation bilaterally Cardio: regular rate and rhythm, S1, S2 normal, no murmur, click, rub or gallop GI: soft, non-tender; bowel sounds normal; no masses,  no organomegaly Extremities: extremities normal, atraumatic, no cyanosis or edema Neurologic: Alert and oriented X 3, normal strength and tone. Normal symmetric reflexes. Normal coordination and gait  Lab Results:  Basic Metabolic Panel:  Recent Labs Lab  09/28/13 1957 09/29/13 0645  NA 138 140  K 4.0 3.7  CL 102 103  CO2 24 25  GLUCOSE 105* 100*  BUN 20 17  CREATININE 1.43* 1.26  CALCIUM 9.6 9.0   CBC:  Recent Labs Lab 09/28/13 1957 09/29/13 0645  WBC 7.1 4.7  HGB 14.3 13.4  HCT 41.2 39.5  MCV 86.4 86.8  PLT 273 247   Cardiac Enzymes:  Recent Labs Lab 09/28/13 1957 09/29/13 09/29/13 0645 09/29/13 1236  TROPONINI <0.30 <0.30 <0.30 <0.30   BNP (last 3 results)  Recent Labs  09/28/13 2028  PROBNP 22.8    Studies/Results: Dg Chest Portable 1 View  09/28/2013   CLINICAL DATA Chest pain, shortness of breath  EXAM PORTABLE CHEST - 1 VIEW  COMPARISON None.  FINDINGS The heart size and mediastinal contours are within normal limits. Calcified left hilar lymph node from old granulomatous disease is noted. Both lungs are clear. The visualized skeletal structures are unremarkable.  IMPRESSION No active cardiopulmonary disease.  SIGNATURE  Electronically Signed   By: Sherian ReinWei-Chen  Lin M.D.   On: 09/28/2013 21:12    Medications:  Scheduled: . aspirin EC  325 mg Oral Daily  . enoxaparin (LOVENOX) injection  40 mg Subcutaneous QHS  . nitroGLYCERIN  0.5 inch Topical 4 times per day   Continuous: . sodium chloride 20 mL/hr at 09/28/13 2300  . sodium chloride 10 mL/hr at 09/28/13 2333   GLO:[id number]:acetaminophen, acetaminophen,  alum & mag hydroxide-simeth, HYDROmorphone (DILAUDID) injection, ondansetron (ZOFRAN) IV, ondansetron, oxyCODONE  Assessment/Plan:  Principal Problem:   Chest pain    Chest Pain Possibly due to cocaine. EKG shows diffuse ST changes but unchanged this morning. Pain not characteristic of pericarditis. Previous history of ablation of accessory pathway. Appreciate cardiology input. Has ruled out for ACS. Stress test today.   Cocaine Use Patient counseled to stop illicit drug use. Educated on vascular side effects of cocaine.  Code Status: Full Code  DVT Prophylaxis: Enoxaparin    Family Communication:  Discussed with patient  Disposition Plan: Can likely be discharged later today depending on results of stress test.    LOS: 2 days   [facility]  [facility] Pager [phone] 09/30/2013, 7:38 AM  If 8PM-8AM, please contact night-coverage at [email or web address], password [facility]

## 2013-10-01 ENCOUNTER — Encounter (HOSPITAL_COMMUNITY)
Admission: EM | Disposition: A | Discharge status: Home / Self Care | Payer: Self-pay | Source: Home / Self Care | Attending: Internal Medicine

## 2013-10-01 DIAGNOSIS — I471 Supraventricular tachycardia, unspecified: Secondary | ICD-10-CM

## 2013-10-01 DIAGNOSIS — F1490 Cocaine use, unspecified, uncomplicated: Secondary | ICD-10-CM | POA: Diagnosis present

## 2013-10-01 DIAGNOSIS — I209 Angina pectoris, unspecified: Secondary | ICD-10-CM

## 2013-10-01 HISTORY — PX: LEFT HEART CATHETERIZATION WITH CORONARY ANGIOGRAM: SHX5451

## 2013-10-01 SURGERY — LEFT HEART CATHETERIZATION WITH CORONARY ANGIOGRAM
Anesthesia: LOCAL

## 2013-10-01 MED ORDER — SODIUM CHLORIDE 0.9 % IV SOLN: INTRAVENOUS | Status: DC

## 2013-10-01 MED ORDER — HEPARIN SODIUM (PORCINE) 1000 UNIT/ML IJ SOLN
INTRAMUSCULAR | Status: AC
  Filled 2013-10-01: qty 1

## 2013-10-01 MED ORDER — NITROGLYCERIN 0.2 MG/ML ON CALL CATH LAB
INTRAVENOUS | Status: AC
  Filled 2013-10-01: qty 1

## 2013-10-01 MED ORDER — FENTANYL CITRATE 0.05 MG/ML IJ SOLN
INTRAMUSCULAR | Status: AC
  Filled 2013-10-01: qty 2

## 2013-10-01 MED ORDER — AMLODIPINE BESYLATE 5 MG PO TABS: 5.0000 mg | ORAL_TABLET | Freq: Every day | ORAL | Status: AC

## 2013-10-01 MED ORDER — LIDOCAINE HCL (PF) 1 % IJ SOLN
INTRAMUSCULAR | Status: AC
  Filled 2013-10-01: qty 30

## 2013-10-01 MED ORDER — METOPROLOL TARTRATE 25 MG PO TABS
25.0000 mg | ORAL_TABLET | Freq: Two times a day (BID) | ORAL | Status: DC
  Administered 2013-10-01: 25 mg via ORAL
  Filled 2013-10-01 (×2): qty 1

## 2013-10-01 MED ORDER — HEPARIN (PORCINE) IN NACL 2-0.9 UNIT/ML-% IJ SOLN
INTRAMUSCULAR | Status: AC
  Filled 2013-10-01: qty 2000

## 2013-10-01 MED ORDER — MIDAZOLAM HCL 2 MG/2ML IJ SOLN
INTRAMUSCULAR | Status: AC
  Filled 2013-10-01: qty 2

## 2013-10-01 MED ORDER — VERAPAMIL HCL 2.5 MG/ML IV SOLN
INTRAVENOUS | Status: AC
  Filled 2013-10-01: qty 2

## 2013-10-01 NOTE — CV Procedure (Signed)
     Left Heart Catheterization with Coronary Angiography  Report  Alan GlassmanDerrick Rasmussen  43 y.o.  male 05/13/1971  Procedure Date: 10/01/2013  Referring Physician: Nicki Guadalajarahomas Kelly, M.D. Primary Cardiologist: Nicki Guadalajarahomas Kelly, M.D.  INDICATIONS: Angina pectoris with equivocal nuclear stress test  PROCEDURE: 1. Left heart catheterization; 2. Coronary angiography; 3. Left ventriculography  CONSENT:  The risks, benefits, and details of the procedure were explained in detail to the patient. Risks including death, stroke, heart attack, kidney injury, allergy, limb ischemia, bleeding and radiation injury were discussed.  The patient verbalized understanding and wanted to proceed.  Informed written consent was obtained.  PROCEDURE TECHNIQUE:  After Xylocaine anesthesia a 5 French Slender sheath was placed in the right radial artery using an Angiocath and the Seldinger technique..  Coronary angiography was done using a diagnostic JR 4 and JL 3.5 cm catheter.  Left ventriculography was done using the JR 4 catheter and hand injection.   No significant obstructive disease is noted. After scrutinizing  the images in the cath lab the case was terminated.  MEDICATIONS: 2 mg of IV Versed and 50 mcg fentanyl  CONTRAST:  Total of 70 cc.  COMPLICATIONS:  None   HEMODYNAMICS:  Aortic pressure 120/78 mmHg; LV pressure 120/10 mmHg; LVEDP 14 mm mercury  ANGIOGRAPHIC DATA:   The left main coronary artery is normal.  The left anterior descending artery is normal. This is a large vessel that wraps around the left ventricular apex..  The left circumflex artery is dominant, widely patent, and perfectly normal. A large first obtuse marginal branch trifurcates on the lateral wall. The PDA arises from the distal circumflex..  The right coronary artery is nondominant.   LEFT VENTRICULOGRAM:  Left ventricular angiogram was done in the 30 RAO projection and revealed normal LV cavity size and contractility with an EF of  55%   IMPRESSIONS:  1. Normal coronary arteries with a left dominant circumflex coronary  2. Normal LV function   RECOMMENDATION:  No further cardiovascular workup needed at this time..Marland Kitchen

## 2013-10-01 NOTE — Progress Notes (Signed)
UR completed. Patient changed to inpatient- abnormal stress test  

## 2013-10-01 NOTE — Discharge Summary (Signed)
[facility]  Physician Discharge Summary   Patient ID: Alan Rasmussen MRN: [id number] DOB/AGE: 03-06-1971 43 y.o.  Admit date: 09/28/2013 Discharge date: 10/01/2013  PCP: His providers are in [place]:  Principal Problem:   Chest pain Active Problems:   H/o SVT (supraventricular tachycardia); s/p ablation   Cocaine use   RECOMMENDATIONS FOR OUTPATIENT FOLLOW UP: 1. Patient asked to follow up with his PCP at [facility] when he returns next week. 2. He needs to be managed for elevated BP.  DISCHARGE CONDITION: fair  Diet recommendation: Low Sodium  Filed Weights   09/28/13 2321  Weight: 75.07 kg (165 lb 8 oz)    INITIAL HISTORY: Alan Rasmussen is a 43 y.o. male with previous history of ablation for accessory pathway in 2007 in [place]. He presented with chest pressure mainly with exertion along with shortness of breath. He is here this week on a work project in [place] from [place].   Consultations:  Cardiology  Procedures: Cardiac Catheterization 3/13: No CAD  Treadmill Nuclear Stress test 3/12: See report below  2D ECHO 3/11  Left ventricle: The cavity size was normal. Systolic function was normal. The estimated ejection fraction was in the range of 55% to 60%. Wall motion was normal; there were no regional wall motion abnormalities. Left ventricular diastolic function parameters were normal. No pericardial effusion.  HOSPITAL COURSE:   Chest Pain  This was possibly due to cocaine but he did have exertional component as well. EKG showed diffuse ST changes with elevation but unchanged subsequently. Pericarditis was considered but pain was not characteristic of pericarditis. No pericardial effusion noted on ECHO. ESR and CRp were normal. He had a previous history of ablation of accessory pathway. Cardiology was consulted for further management. ECHO did not reveal wall motion abnormalities. He underwent a stress test which was abnormal. He  then underwent cardiac catheterization which revealed normal coronaries. He also ruled out for ACS. D dimer was normal. He will follow with his PCP.  Cocaine Use  Patient counseled to stop illicit drug use. Educated on vascular side effects of cocaine.  Elevated Blood Pressure/Hypertension BP noted to be elevated in the hospital. Treatment was discussed and he was prescribed Amlodipine. He will discuss further with his PCP in [place].  Since catheterization was unremarkable he was stable for discharge. CP could have been due to cocaine. Patient was asked to avoid cocaine and follow up with his PCP.  PERTINENT LABS:  The results of significant diagnostics from this hospitalization (including imaging, microbiology, ancillary and laboratory) are listed below for reference.     Labs: Basic Metabolic Panel:  Recent Labs Lab 09/28/13 1957 09/29/13 0645  NA 138 140  K 4.0 3.7  CL 102 103  CO2 24 25  GLUCOSE 105* 100*  BUN 20 17  CREATININE 1.43* 1.26  CALCIUM 9.6 9.0   CBC:  Recent Labs Lab 09/28/13 1957 09/29/13 0645  WBC 7.1 4.7  HGB 14.3 13.4  HCT 41.2 39.5  MCV 86.4 86.8  PLT 273 247   Cardiac Enzymes:  Recent Labs Lab 09/28/13 1957 09/29/13 09/29/13 0645 09/29/13 1236  TROPONINI <0.30 <0.30 <0.30 <0.30   BNP: BNP (last 3 results)  Recent Labs  09/28/13 2028  PROBNP 22.8    IMAGING STUDIES Nm Myocar Multi W/spect W/wall Motion / Ef  09/30/2013   CLINICAL DATA:  43 year old with chest discomfort.  EXAM: MYOCARDIAL IMAGING WITH SPECT (REST AND EXERCISE)  GATED LEFT VENTRICULAR WALL MOTION STUDY  LEFT VENTRICULAR  EJECTION FRACTION  TECHNIQUE: Standard myocardial SPECT imaging was performed after resting intravenous injection of 10 mCi Tc-1m sestamibi. Subsequently, exercise tolerance test was performed by the patient under the supervision of the Cardiology staff. At peak-stress, 30 mCi Tc-24m sestamibi was injected intravenously and standard myocardial SPECT  imaging was performed. Quantitative gated imaging was also performed to evaluate left ventricular wall motion, and estimate left ventricular ejection fraction.  COMPARISON:  Chest radiograph 09/28/2013  FINDINGS: This study has limitations due to a large amount of a gut uptake on the resting images. As result, it is difficult to evaluate the inferior wall mid and basal segments. Reversibility along the inferior wall cannot be excluded. There also appears to be decreased uptake along the mid anterior wall on the stress images.  The peak end-diastolic volume is 333 ml and end systolic volume is 51 ml. Calculated ejection fraction is 52%. There is mild hypokinesia along the septal wall. Otherwise, the wall motion is normal.  IMPRESSION: Myocardial perfusion imaging is equivocal and cannot exclude areas of reversibility. Question reversibility along the mid segment of the anterior wall. Evaluation of the inferior wall is markedly limited due to large amount of gut uptake on the resting images.  Calculated ejection fraction is 52%. Mild hypokinesia along the septal wall.   Electronically Signed   By: Markus Daft M.D.   On: 09/30/2013 15:39   Dg Chest Portable 1 View  09/28/2013   CLINICAL DATA Chest pain, shortness of breath  EXAM PORTABLE CHEST - 1 VIEW  COMPARISON None.  FINDINGS The heart size and mediastinal contours are within normal limits. Calcified left hilar lymph node from old granulomatous disease is noted. Both lungs are clear. The visualized skeletal structures are unremarkable.  IMPRESSION No active cardiopulmonary disease.  SIGNATURE  Electronically Signed   By: Abelardo Diesel M.D.   On: 09/28/2013 21:12    DISCHARGE EXAMINATION: See progress note from earlier today. Discharge vitals were stable.  DISPOSITION: Home  Discharge Orders   Future Orders Complete By Expires   Diet - low sodium heart healthy  As directed    Discharge instructions  As directed    Comments:     Follow up with your PCP  or cardiologist in [place] within next 2 weeks. Seek attention if you notice swelling in right wrist or feel tingling numbness or pain in right fingers. Stop doing recreational drugs.   Increase activity slowly  As directed       ALLERGIES: No Known Allergies  Discharge Medication List as of 10/01/2013  4:26 PM    START taking these medications   Details  amLODipine (NORVASC) 5 MG tablet Take 1 tablet (5 mg total) by mouth daily., Starting 10/01/2013, Until Discontinued, Print         TOTAL DISCHARGE TIME: 35 mins  [facility] Pager [phone]  10/01/2013, 6:02 PM

## 2013-10-01 NOTE — Progress Notes (Signed)
Patient back from cath lab, alert and oriented, denies any pain/distress. Right wrist cath site, dressing clean/dry/intact. Notified Dr. Rito EhrlichKrishnan notified that patient is back in the room.

## 2013-10-01 NOTE — Discharge Instructions (Signed)
Cocaine Abuse and Chemical Dependency °WHEN IS DRUG USE A PROBLEM? °Anytime drug use is interfering with normal living activities it has become abuse. This includes problems with family and friends. Psychological dependence has developed when your mind tells you that the drug is needed. This is usually followed by physical dependence which has developed when continuing increases of drug are required to get the same feeling or "high". This is known as addiction or chemical dependency. A person's risk is much higher if there is a history of chemical dependency in the family. °SIGNS OF CHEMICAL DEPENDENCY: °· Been told by friends or family that drugs have become a problem. °· Fighting when using drugs. °· Having blackouts (not remembering what you do while using). °· Feel sick from using drugs but continue using. °· Lie about use or amounts of drugs (chemicals) used. °· Need chemicals to get you going. °· Suffer in work performance or school because of drug use. °· Get sick from use of drugs but continue to use anyway. °· Need drugs to relate to people or feel comfortable in social situations. °· Use drugs to forget problems. °Yes answered to any of the above signs of chemical dependency indicates there are problems. The longer the use of drugs continues, the greater the problems will become. °If there is a family history of drug or alcohol use it is best not to experiment with these drugs. Experimentation leads to tolerance and needing to use more of the drug to get the same feeling. This is followed by addiction where drugs become the most important part of life. It becomes more important to take drugs than participate in the other usual activities of life including relating to friends and family. Addiction is followed by dependency where drugs are now needed not just to get high but to feel normal. °Addiction cannot be cured but it can be stopped. This often requires outside help and the care of professionals.  Treatment centers are listed in the yellow pages under: Cocaine, Narcotics, and Alcoholics Anonymous. Most hospitals and clinics can refer you to a specialized care center. °WHAT IS COCAINE? °Cocaine is a strong nervous system stimulant which speeds up the body and gives the user the feeling that they have increased energy, loss of appetite and feelings of great pleasure. This "high" which begins within several minutes and lasts for less than an hour is followed by a "crash". The crash and depressed feelings that come with it cause a craving for the drug to regain the high. °HOW IS COCANINE USED? °Cocaine is snorted, injected, and smoked as free- base or crack. Because smoking the drug produces a greater high it is also associated with a greater low. It is therefore more rapidly addicting. °WHAT ARE THE EFFECTS OF COCAINE? °It is an anesthetic (pain killer) and a stimulant (it causes a high which gives a false feeling of well being). It increases heart and breathing rates with increases in body temperature and blood pressure. It removes appetite. It causes seizures (convulsions) along with nausea (feeling sick to your stomach), vomiting and stomach pain. This dangerous combination can lead to death. Trying to keep the high feeling leads to greater and greater drug use and this leads to addiction. °Addiction can only be helped by stopping use of all chemicals. This is hard but may save your life. If the addiction is continued, the only possible outcome is loss of self respect and self esteem, violence, death, and eventually prison if the addict is fortunate   continued, the only possible outcome is loss of self respect and self esteem, violence, death, and eventually prison if the addict is fortunate enough to be caught and able to receive help prior to this last life ending event.  OTHER HEALTH RISKS OF COCAINE AND ALL DRUG USE ARE:   The increased possibility of getting AIDS or hepatitis (liver inflammation).   Having a baby born which is addicted to cocaine and must go through painful withdrawal including shaking, jerking, and crying in pain. Many of the  babies die. Other babies go through life with lifelong disabilities and learning problems.  HOW TO STAY DRUG FREE ONCE YOU HAVE QUIT USING:   Develop healthy activities and form friends who do not use drugs.   Stay away from the drug scene.   Tell the pusher or former friend you have other better things to do.   Have ready excuses available about why you cannot use.  For more help or information contact your local physician, clinic, hospital or dial 1-800-cocaine (1-800-262-2463).  Document Released: 07/05/2000 Document Revised: 09/30/2011 Document Reviewed: 02/24/2008  Exit[employer] Patient Information 2014 [employer].

## 2013-10-01 NOTE — Progress Notes (Signed)
Subjective:  By history pt admits to exertional chest pain.  Objective:   Vital Signs in the last 24 hours: Temp:  [98 F (36.7 C)-98.3 F (36.8 C)] 98.3 F (36.8 C) (03/13 0559) Pulse Rate:  [69-155] 75 (03/13 0559) Resp:  [18-20] 18 (03/13 0559) BP: (120-164)/(75-99) 131/94 mmHg (03/13 0559) SpO2:  [95 %-100 %] 95 % (03/13 0559)  Intake/Output from previous day: 03/12 0701 - 03/13 0700 In: 240 [P.O.:240] Out: -   Medications: . aspirin EC  325 mg Oral Daily  . enoxaparin (LOVENOX) injection  40 mg Subcutaneous QHS    . sodium chloride 20 mL/hr at 09/28/13 2300  . sodium chloride 10 mL/hr at 09/28/13 2333    Physical Exam:   General appearance: alert, cooperative and no distress Neck: no adenopathy, no carotid bruit, no JVD, supple, symmetrical, trachea midline and thyroid not enlarged, symmetric, no tenderness/mass/nodules Lungs: clear to auscultation bilaterally Heart: regular rate and rhythm and 1/6 sem Abdomen: soft, non-tender; bowel sounds normal; no masses,  no organomegaly Extremities: no edema, redness or tenderness in the calves or thighs Pulses: 2+ and symmetric Neuro: grossly normal   Rate: 75  Rhythm: normal sinus rhythm  Lab Results:     Recent Labs  09/28/13 1957 09/29/13 0645  NA 138 140  K 4.0 3.7  CL 102 103  CO2 24 25  GLUCOSE 105* 100*  BUN 20 17  CREATININE 1.43* 1.26   CBC    Component Value Date/Time   WBC 4.7 09/29/2013 0645   RBC 4.55 09/29/2013 0645   HGB 13.4 09/29/2013 0645   HCT 39.5 09/29/2013 0645   PLT 247 09/29/2013 0645   MCV 86.8 09/29/2013 0645   MCH 29.5 09/29/2013 0645   MCHC 33.9 09/29/2013 0645   RDW 12.9 09/29/2013 0645      Recent Labs  09/29/13 0645 09/29/13 1236  TROPONINI <0.30 <0.30   Hepatic Function Panel No results found for this basename: PROT, ALBUMIN, AST, ALT, ALKPHOS, BILITOT, BILIDIR, IBILI,  in the last 72 hours  Recent Labs  09/28/13 2031  INR 0.94   BNP (last 3  results)  Recent Labs  09/28/13 2028  PROBNP 22.8    Lipid Panel  No results found for this basename: chol,  trig,  hdl,  cholhdl,  vldl,  ldlcalc      Imaging:  Nm Myocar Multi W/spect W/wall Motion / Ef  09/30/2013   CLINICAL DATA:  43 year old with chest discomfort.  EXAM: MYOCARDIAL IMAGING WITH SPECT (REST AND EXERCISE)  GATED LEFT VENTRICULAR WALL MOTION STUDY  LEFT VENTRICULAR EJECTION FRACTION  TECHNIQUE: Standard myocardial SPECT imaging was performed after resting intravenous injection of 10 mCi Tc-2832m sestamibi. Subsequently, exercise tolerance test was performed by the patient under the supervision of the Cardiology staff. At peak-stress, 30 mCi Tc-6432m sestamibi was injected intravenously and standard myocardial SPECT imaging was performed. Quantitative gated imaging was also performed to evaluate left ventricular wall motion, and estimate left ventricular ejection fraction.  COMPARISON:  Chest radiograph 09/28/2013  FINDINGS: This study has limitations due to a large amount of a gut uptake on the resting images. As result, it is difficult to evaluate the inferior wall mid and basal segments. Reversibility along the inferior wall cannot be excluded. There also appears to be decreased uptake along the mid anterior wall on the stress images.  The peak end-diastolic volume is 106 ml and end systolic volume is 51 ml. Calculated ejection fraction is 52%. There is mild hypokinesia  along the septal wall. Otherwise, the wall motion is normal.  IMPRESSION: Myocardial perfusion imaging is equivocal and cannot exclude areas of reversibility. Question reversibility along the mid segment of the anterior wall. Evaluation of the inferior wall is markedly limited due to large amount of gut uptake on the resting images.  Calculated ejection fraction is 52%. Mild hypokinesia along the septal wall.   Electronically Signed   By: Richarda Overlie M.D.   On: 09/30/2013 15:39      Assessment/Plan:   Principal  Problem:   Chest pain Active Problems:   H/o SVT (supraventricular tachycardia); s/p ablation   Nuclear study reviewed with patient and wife. 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 Will add low dose BB.   Alan Bihari, MD, [facility] 10/01/2013, 8:41 AM

## 2013-10-01 NOTE — Progress Notes (Signed)
[facility] PROGRESS NOTE  Alan Rasmussen WUJ:[id number] DOB: [birth year] DOA: 09/28/2013  PCP: None  Brief HPI: Alan Rasmussen is a 43 y.o. male with previous history of ablation for accessory pathway in 2007 in [place]. He presented with chest pressure mainly with exertion along with shortness of breath. He is here this week on a work project in [place] from [place].   Consultants: Cards  Procedures:  2D ECHO 3/11 Left ventricle: The cavity size was normal. Systolic function was normal. The estimated ejection fraction was in the range of 55% to 60%. Wall motion was normal; there were no regional wall motion abnormalities. Left ventricular diastolic function parameters were normal. No pericardial effusion.  Antibiotics: None  Subjective: Patient feels same. Some chest discomfort at 2/10 but much better than before. No other symptoms.   Objective: Vital Signs  Filed Vitals:   09/30/13 1127 09/30/13 1403 09/30/13 2223 10/01/13 0559  BP: 136/97 141/99 141/83 131/94  Pulse: 77 79 69 75  Temp: 98 F (36.7 C) 98 F (36.7 C) 98.3 F (36.8 C) 98.3 F (36.8 C)  TempSrc: Oral Oral Oral Oral  Resp: 20 18 18 18   Height:      Weight:      SpO2: 100% 100% 97% 95%    Intake/Output Summary (Last 24 hours) at 10/01/13 9562 Last data filed at 09/30/13 1404  Gross per 24 hour  Intake    240 ml  Output      0 ml  Net    240 ml   Filed Weights   09/28/13 2321  Weight: 75.07 kg (165 lb 8 oz)   General appearance: alert, cooperative, appears stated age and no distress Resp: clear to auscultation bilaterally Cardio: regular rate and rhythm, S1, S2 normal, no murmur, click, rub or gallop GI: soft, non-tender; bowel sounds normal; no masses,  no organomegaly Extremities: extremities normal, atraumatic, no cyanosis or edema Neurologic: Alert and oriented X 3, normal strength and tone. Normal symmetric reflexes. Normal coordination and gait  Lab Results:  Basic Metabolic  Panel:  Recent Labs Lab 09/28/13 1957 09/29/13 0645  NA 138 140  K 4.0 3.7  CL 102 103  CO2 24 25  GLUCOSE 105* 100*  BUN 20 17  CREATININE 1.43* 1.26  CALCIUM 9.6 9.0   CBC:  Recent Labs Lab 09/28/13 1957 09/29/13 0645  WBC 7.1 4.7  HGB 14.3 13.4  HCT 41.2 39.5  MCV 86.4 86.8  PLT 273 247   Cardiac Enzymes:  Recent Labs Lab 09/28/13 1957 09/29/13 09/29/13 0645 09/29/13 1236  TROPONINI <0.30 <0.30 <0.30 <0.30   BNP (last 3 results)  Recent Labs  09/28/13 2028  PROBNP 22.8    Studies/Results: Nm Myocar Multi W/spect W/wall Motion / Ef  09/30/2013   CLINICAL DATA:  43 year old with chest discomfort.  EXAM: MYOCARDIAL IMAGING WITH SPECT (REST AND EXERCISE)  GATED LEFT VENTRICULAR WALL MOTION STUDY  LEFT VENTRICULAR EJECTION FRACTION  TECHNIQUE: Standard myocardial SPECT imaging was performed after resting intravenous injection of 10 mCi Tc-54m sestamibi. Subsequently, exercise tolerance test was performed by the patient under the supervision of the Cardiology staff. At peak-stress, 30 mCi Tc-9m sestamibi was injected intravenously and standard myocardial SPECT imaging was performed. Quantitative gated imaging was also performed to evaluate left ventricular wall motion, and estimate left ventricular ejection fraction.  COMPARISON:  Chest radiograph 09/28/2013  FINDINGS: This study has limitations due to a large amount of a gut uptake on the resting images. As result, it is  difficult to evaluate the inferior wall mid and basal segments. Reversibility along the inferior wall cannot be excluded. There also appears to be decreased uptake along the mid anterior wall on the stress images.  The peak end-diastolic volume is 106 ml and end systolic volume is 51 ml. Calculated ejection fraction is 52%. There is mild hypokinesia along the septal wall. Otherwise, the wall motion is normal.  IMPRESSION: Myocardial perfusion imaging is equivocal and cannot exclude areas of reversibility.  Question reversibility along the mid segment of the anterior wall. Evaluation of the inferior wall is markedly limited due to large amount of gut uptake on the resting images.  Calculated ejection fraction is 52%. Mild hypokinesia along the septal wall.   Electronically Signed   By: Richarda OverlieAdam  Henn M.D.   On: 09/30/2013 15:39    Medications:  Scheduled: . aspirin EC  325 mg Oral Daily  . enoxaparin (LOVENOX) injection  40 mg Subcutaneous QHS   Continuous: . sodium chloride 20 mL/hr at 09/28/13 2300  . sodium chloride 10 mL/hr at 09/28/13 2333   ZOX:[id number]:acetaminophen, acetaminophen, alum & mag hydroxide-simeth, HYDROmorphone (DILAUDID) injection, ondansetron (ZOFRAN) IV, ondansetron, oxyCODONE  Assessment/Plan:  Principal Problem:   Chest pain    Chest Pain Possibly due to cocaine but he does have exertional component as well. EKG shows diffuse ST changes but unchanged this morning. Pain not characteristic of pericarditis. Previous history of ablation of accessory pathway. Appreciate cardiology input. Has ruled out for ACS. Stress test not normal. Some reversibility and hypokinesis noted. Discussed with Dr. Tresa EndoKelly and plan is for cardiac cath today. Patient agreeable. Dr. Tresa EndoKelly to discuss with him as well.  Cocaine Use Patient counseled to stop illicit drug use. Educated on vascular side effects of cocaine.  Code Status: Full Code  DVT Prophylaxis: Enoxaparin    Family Communication: Discussed with patient  Disposition Plan: Depends on cath findings.    LOS: 3 days   [facility] Plains Memorial HospitalKRISHNAN,Calil Amor  [facility] Pager [phone] 10/01/2013, 8:32 AM  If 8PM-8AM, please contact night-coverage at [email or web address], password [email or web address]

## 2013-10-01 NOTE — H&P (View-Only) (Signed)
Subjective:  By history pt admits to exertional chest pain.  Objective:   Vital Signs in the last 24 hours: Temp:  [98 F (36.7 C)-98.3 F (36.8 C)] 98.3 F (36.8 C) (03/13 0559) Pulse Rate:  [69-155] 75 (03/13 0559) Resp:  [18-20] 18 (03/13 0559) BP: (120-164)/(75-99) 131/94 mmHg (03/13 0559) SpO2:  [95 %-100 %] 95 % (03/13 0559)  Intake/Output from previous day: 03/12 0701 - 03/13 0700 In: 240 [P.O.:240] Out: -   Medications: . aspirin EC  325 mg Oral Daily  . enoxaparin (LOVENOX) injection  40 mg Subcutaneous QHS    . sodium chloride 20 mL/hr at 09/28/13 2300  . sodium chloride 10 mL/hr at 09/28/13 2333    Physical Exam:   General appearance: alert, cooperative and no distress Neck: no adenopathy, no carotid bruit, no JVD, supple, symmetrical, trachea midline and thyroid not enlarged, symmetric, no tenderness/mass/nodules Lungs: clear to auscultation bilaterally Heart: regular rate and rhythm and 1/6 sem Abdomen: soft, non-tender; bowel sounds normal; no masses,  no organomegaly Extremities: no edema, redness or tenderness in the calves or thighs Pulses: 2+ and symmetric Neuro: grossly normal   Rate: 75  Rhythm: normal sinus rhythm  Lab Results:     Recent Labs  09/28/13 1957 09/29/13 0645  NA 138 140  K 4.0 3.7  CL 102 103  CO2 24 25  GLUCOSE 105* 100*  BUN 20 17  CREATININE 1.43* 1.26   CBC    Component Value Date/Time   WBC 4.7 09/29/2013 0645   RBC 4.55 09/29/2013 0645   HGB 13.4 09/29/2013 0645   HCT 39.5 09/29/2013 0645   PLT 247 09/29/2013 0645   MCV 86.8 09/29/2013 0645   MCH 29.5 09/29/2013 0645   MCHC 33.9 09/29/2013 0645   RDW 12.9 09/29/2013 0645      Recent Labs  09/29/13 0645 09/29/13 1236  TROPONINI <0.30 <0.30   Hepatic Function Panel No results found for this basename: PROT, ALBUMIN, AST, ALT, ALKPHOS, BILITOT, BILIDIR, IBILI,  in the last 72 hours  Recent Labs  09/28/13 2031  INR 0.94   BNP (last 3  results)  Recent Labs  09/28/13 2028  PROBNP 22.8    Lipid Panel  No results found for this basename: chol,  trig,  hdl,  cholhdl,  vldl,  ldlcalc      Imaging:  Nm Myocar Multi W/spect W/wall Motion / Ef  09/30/2013   CLINICAL DATA:  43 year old with chest discomfort.  EXAM: MYOCARDIAL IMAGING WITH SPECT (REST AND EXERCISE)  GATED LEFT VENTRICULAR WALL MOTION STUDY  LEFT VENTRICULAR EJECTION FRACTION  TECHNIQUE: Standard myocardial SPECT imaging was performed after resting intravenous injection of 10 mCi Tc-2832m sestamibi. Subsequently, exercise tolerance test was performed by the patient under the supervision of the Cardiology staff. At peak-stress, 30 mCi Tc-6432m sestamibi was injected intravenously and standard myocardial SPECT imaging was performed. Quantitative gated imaging was also performed to evaluate left ventricular wall motion, and estimate left ventricular ejection fraction.  COMPARISON:  Chest radiograph 09/28/2013  FINDINGS: This study has limitations due to a large amount of a gut uptake on the resting images. As result, it is difficult to evaluate the inferior wall mid and basal segments. Reversibility along the inferior wall cannot be excluded. There also appears to be decreased uptake along the mid anterior wall on the stress images.  The peak end-diastolic volume is 106 ml and end systolic volume is 51 ml. Calculated ejection fraction is 52%. There is mild hypokinesia  along the septal wall. Otherwise, the wall motion is normal.  IMPRESSION: Myocardial perfusion imaging is equivocal and cannot exclude areas of reversibility. Question reversibility along the mid segment of the anterior wall. Evaluation of the inferior wall is markedly limited due to large amount of gut uptake on the resting images.  Calculated ejection fraction is 52%. Mild hypokinesia along the septal wall.   Electronically Signed   By: Richarda Overlie M.D.   On: 09/30/2013 15:39      Assessment/Plan:   Principal  Problem:   Chest pain Active Problems:   H/o SVT (supraventricular tachycardia); s/p ablation   Nuclear study reviewed with patient and wife. 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 Will add low dose BB.   Alan Bihari, MD, [facility] 10/01/2013, 8:41 AM

## 2013-10-01 NOTE — Interval H&P Note (Signed)
Cath Lab Visit (complete for each Cath Lab visit)  Clinical Evaluation Leading to the Procedure:   ACS: no  Non-ACS:    Anginal Classification: CCS III  Anti-ischemic medical therapy: Minimal Therapy (1 class of medications)  Non-Invasive Test Results: Intermediate-risk stress test findings: cardiac mortality 1-3%/year  Prior CABG: No previous CABG      History and Physical Interval Note:  10/01/2013 12:08 PM  Alan Rasmussen  has presented today for surgery, with the diagnosis of Abnormal Nuclear Stress Test  The various methods of treatment have been discussed with the patient and family. After consideration of risks, benefits and other options for treatment, the patient has consented to  Procedure(s): LEFT HEART CATHETERIZATION WITH CORONARY ANGIOGRAM (N/A) as a surgical intervention .  The patient's history has been reviewed, patient examined, no change in status, stable for surgery.  I have reviewed the patient's chart and labs.  Questions were answered to the patient's satisfaction.     Lesleigh NoeSMITH III,HENRY W

## 2013-10-01 NOTE — Progress Notes (Signed)
Patient discharged home, discharge instructions given and explained to patient and he verbalized understanding, denies any pain/distress. Right wrist dressing clean/dry/intact, No wound noted. Patient accompanied home by family.

## 2014-06-30 ENCOUNTER — Encounter (HOSPITAL_COMMUNITY): Payer: Self-pay | Admitting: Interventional Cardiology

## 2015-02-07 IMAGING — CR DG CHEST 1V PORT
1 series · 1 of 1 positions shown · non-contrast
Comparison: none

[AP]
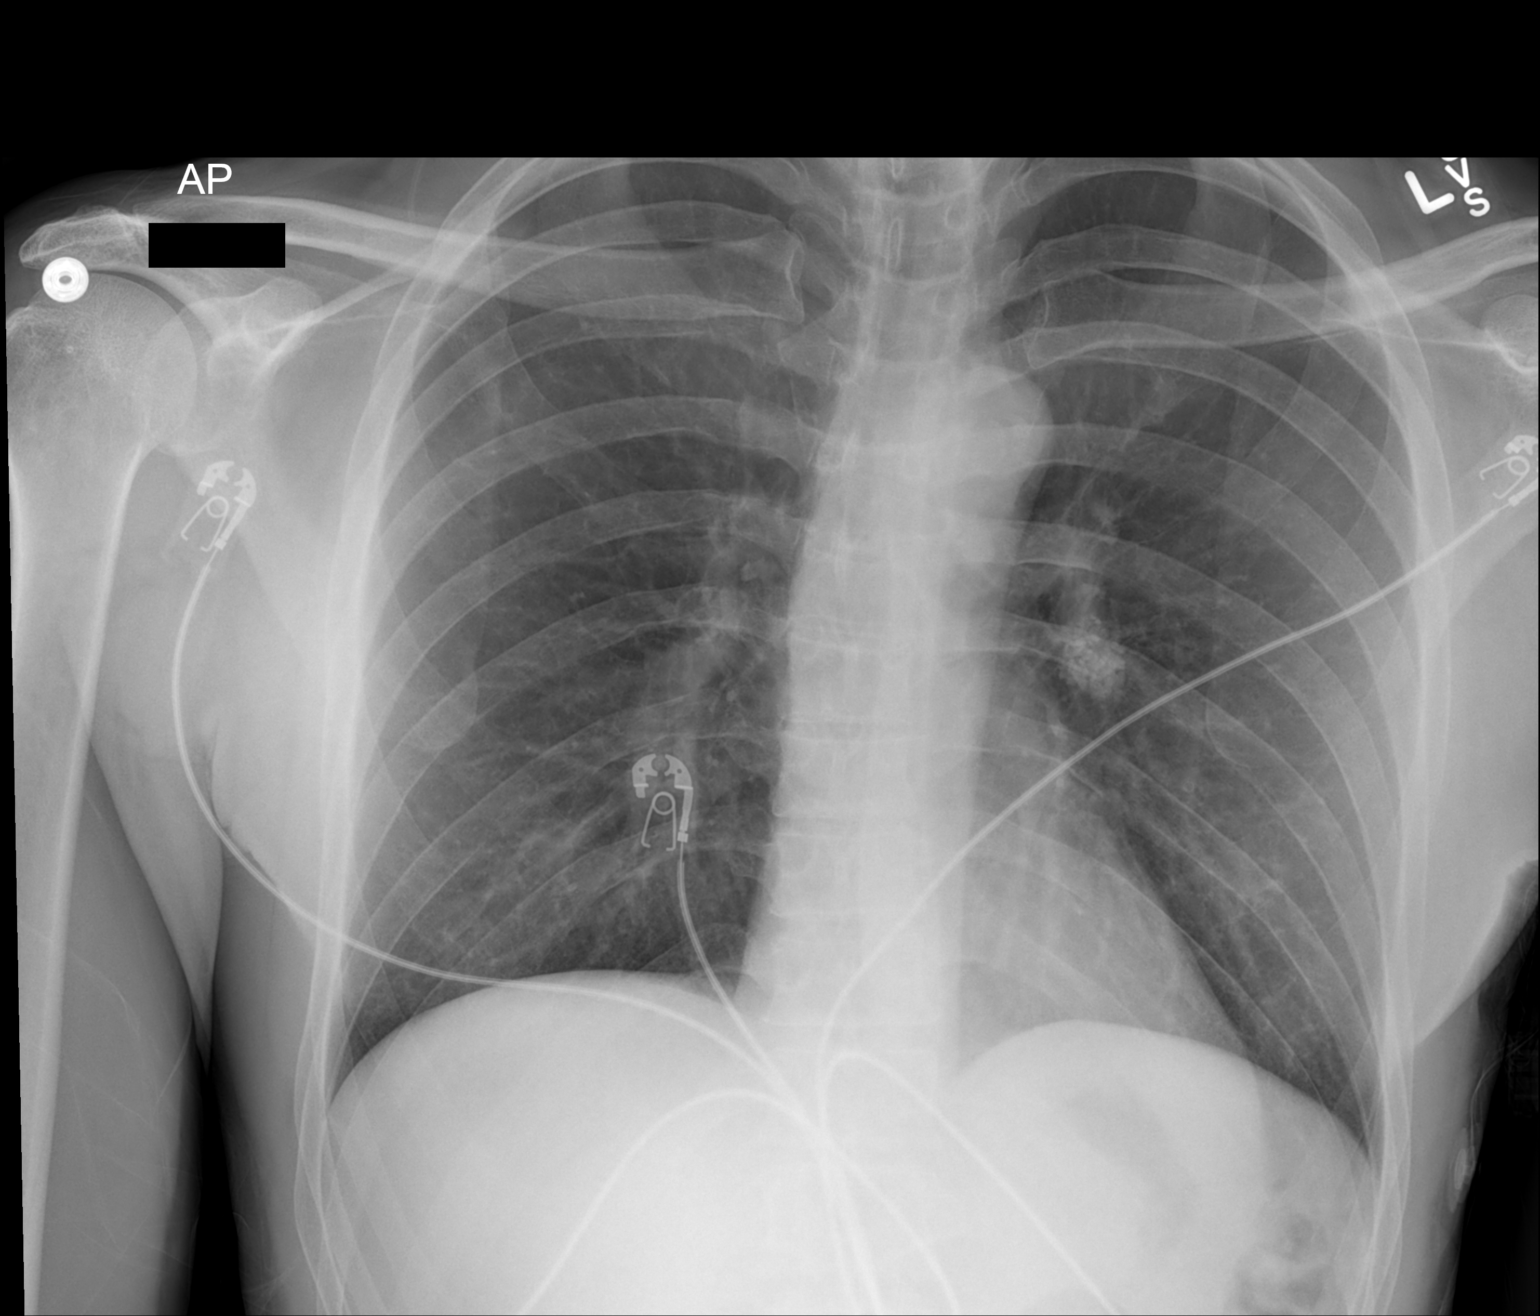

[1 of 1 positions shown; findings below may reference images not displayed]

CLINICAL DATA
Chest pain, shortness of breath

EXAM
PORTABLE CHEST - 1 VIEW

COMPARISON
None.

FINDINGS
The heart size and mediastinal contours are within normal limits.
Calcified left hilar lymph node from old granulomatous disease is
noted. Both lungs are clear. The visualized skeletal structures are
unremarkable.

IMPRESSION
No active cardiopulmonary disease.

SIGNATURE

## 2020-12-27 ENCOUNTER — Other Ambulatory Visit: Payer: Self-pay | Admitting: *Deleted
